# Patient Record
Sex: Male | Born: 1940 | Hispanic: Yes | Marital: Married | Smoking: Never smoker
Health system: Southern US, Community
[De-identification: ages and names within clinical notes are randomized; demographics above are authoritative.]

## PROBLEM LIST (undated history)

## (undated) DIAGNOSIS — F0390 Unspecified dementia without behavioral disturbance: Secondary | ICD-10-CM

---

## 2017-05-24 ENCOUNTER — Emergency Department (HOSPITAL_COMMUNITY)
Admission: EM | Admit: 2017-05-24 | Discharge: 2017-05-24 | Disposition: A | Discharge status: Home / Self Care | Payer: Medicare Other | Attending: Emergency Medicine | Admitting: Emergency Medicine

## 2017-05-24 ENCOUNTER — Other Ambulatory Visit: Payer: Self-pay

## 2017-05-24 ENCOUNTER — Emergency Department (HOSPITAL_COMMUNITY): Payer: Medicare Other

## 2017-05-24 ENCOUNTER — Encounter (HOSPITAL_COMMUNITY): Payer: Self-pay | Admitting: Emergency Medicine

## 2017-05-24 DIAGNOSIS — Z23 Encounter for immunization: Secondary | ICD-10-CM | POA: Insufficient documentation

## 2017-05-24 DIAGNOSIS — Y929 Unspecified place or not applicable: Secondary | ICD-10-CM | POA: Diagnosis not present

## 2017-05-24 DIAGNOSIS — S20312A Abrasion of left front wall of thorax, initial encounter: Secondary | ICD-10-CM | POA: Diagnosis not present

## 2017-05-24 DIAGNOSIS — Y999 Unspecified external cause status: Secondary | ICD-10-CM | POA: Insufficient documentation

## 2017-05-24 DIAGNOSIS — R001 Bradycardia, unspecified: Secondary | ICD-10-CM | POA: Diagnosis not present

## 2017-05-24 DIAGNOSIS — S80819A Abrasion, unspecified lower leg, initial encounter: Secondary | ICD-10-CM | POA: Insufficient documentation

## 2017-05-24 DIAGNOSIS — Y939 Activity, unspecified: Secondary | ICD-10-CM | POA: Diagnosis not present

## 2017-05-24 DIAGNOSIS — S81812A Laceration without foreign body, left lower leg, initial encounter: Secondary | ICD-10-CM | POA: Diagnosis not present

## 2017-05-24 DIAGNOSIS — F0391 Unspecified dementia with behavioral disturbance: Secondary | ICD-10-CM | POA: Diagnosis not present

## 2017-05-24 LAB — TROPONIN I

## 2017-05-24 LAB — CBC WITH DIFFERENTIAL/PLATELET
Basophils Absolute: 0 10*3/uL (ref 0.0–0.1)
Basophils Relative: 0 %
Eosinophils Absolute: 0.3 10*3/uL (ref 0.0–0.7)
Eosinophils Relative: 2 %
HEMATOCRIT: 41.3 % (ref 39.0–52.0)
HEMOGLOBIN: 13.3 g/dL (ref 13.0–17.0)
LYMPHS ABS: 3.1 10*3/uL (ref 0.7–4.0)
LYMPHS PCT: 23 %
MCH: 27.6 pg (ref 26.0–34.0)
MCHC: 32.2 g/dL (ref 30.0–36.0)
MCV: 85.7 fL (ref 78.0–100.0)
MONOS PCT: 5 %
Monocytes Absolute: 0.7 10*3/uL (ref 0.1–1.0)
NEUTROS PCT: 70 %
Neutro Abs: 9.2 10*3/uL — ABNORMAL HIGH (ref 1.7–7.7)
Platelets: 200 10*3/uL (ref 150–400)
RBC: 4.82 MIL/uL (ref 4.22–5.81)
RDW: 13.2 % (ref 11.5–15.5)
WBC: 13.3 10*3/uL — AB (ref 4.0–10.5)

## 2017-05-24 LAB — COMPREHENSIVE METABOLIC PANEL
ALT: 11 U/L — ABNORMAL LOW (ref 17–63)
AST: 19 U/L (ref 15–41)
Albumin: 3.7 g/dL (ref 3.5–5.0)
Alkaline Phosphatase: 57 U/L (ref 38–126)
Anion gap: 11 (ref 5–15)
BUN: 11 mg/dL (ref 6–20)
CHLORIDE: 102 mmol/L (ref 101–111)
CO2: 26 mmol/L (ref 22–32)
Calcium: 9.2 mg/dL (ref 8.9–10.3)
Creatinine, Ser: 0.99 mg/dL (ref 0.61–1.24)
Glucose, Bld: 120 mg/dL — ABNORMAL HIGH (ref 65–99)
POTASSIUM: 3.1 mmol/L — AB (ref 3.5–5.1)
SODIUM: 139 mmol/L (ref 135–145)
Total Bilirubin: 1.1 mg/dL (ref 0.3–1.2)
Total Protein: 7.3 g/dL (ref 6.5–8.1)

## 2017-05-24 LAB — AMMONIA: Ammonia: 24 umol/L (ref 9–35)

## 2017-05-24 LAB — PROTIME-INR
INR: 1.07
Prothrombin Time: 13.8 seconds (ref 11.4–15.2)

## 2017-05-24 MED ORDER — TETANUS-DIPHTH-ACELL PERTUSSIS 5-2.5-18.5 LF-MCG/0.5 IM SUSP
0.5000 mL | Freq: Once | INTRAMUSCULAR | Status: AC
  Administered 2017-05-24: 0.5 mL via INTRAMUSCULAR
  Filled 2017-05-24: qty 0.5

## 2017-05-24 MED ORDER — SODIUM CHLORIDE 0.9 % IV BOLUS (SEPSIS)
1000.0000 mL | Freq: Once | INTRAVENOUS | Status: AC
  Administered 2017-05-24: 1000 mL via INTRAVENOUS

## 2017-05-24 MED ORDER — LIDOCAINE-EPINEPHRINE (PF) 2 %-1:200000 IJ SOLN
10.0000 mL | Freq: Once | INTRAMUSCULAR | Status: AC
  Administered 2017-05-24: 10 mL
  Filled 2017-05-24: qty 20

## 2017-05-24 MED ORDER — LIDOCAINE-EPINEPHRINE (PF) 2 %-1:200000 IJ SOLN: 20.0000 mL | Freq: Once | INTRAMUSCULAR | Status: DC
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
## 2017-05-26 ENCOUNTER — Emergency Department (HOSPITAL_COMMUNITY): Payer: Medicare Other

## 2017-05-26 ENCOUNTER — Other Ambulatory Visit: Payer: Self-pay

## 2017-05-26 ENCOUNTER — Encounter (HOSPITAL_COMMUNITY): Payer: Self-pay | Admitting: *Deleted

## 2017-05-26 ENCOUNTER — Emergency Department (HOSPITAL_COMMUNITY)
Admission: EM | Admit: 2017-05-26 | Discharge: 2017-05-26 | Disposition: A | Discharge status: Home / Self Care | Payer: Medicare Other | Attending: Emergency Medicine | Admitting: Emergency Medicine

## 2017-05-26 DIAGNOSIS — Y999 Unspecified external cause status: Secondary | ICD-10-CM | POA: Insufficient documentation

## 2017-05-26 DIAGNOSIS — Y9389 Activity, other specified: Secondary | ICD-10-CM | POA: Insufficient documentation

## 2017-05-26 DIAGNOSIS — S0990XA Unspecified injury of head, initial encounter: Secondary | ICD-10-CM | POA: Diagnosis present

## 2017-05-26 DIAGNOSIS — R42 Dizziness and giddiness: Secondary | ICD-10-CM

## 2017-05-26 DIAGNOSIS — Y92410 Unspecified street and highway as the place of occurrence of the external cause: Secondary | ICD-10-CM | POA: Insufficient documentation

## 2017-05-26 DIAGNOSIS — Z79899 Other long term (current) drug therapy: Secondary | ICD-10-CM | POA: Insufficient documentation

## 2017-05-26 DIAGNOSIS — S060X0A Concussion without loss of consciousness, initial encounter: Secondary | ICD-10-CM | POA: Diagnosis not present

## 2017-05-26 DIAGNOSIS — F0390 Unspecified dementia without behavioral disturbance: Secondary | ICD-10-CM | POA: Insufficient documentation

## 2017-05-26 DIAGNOSIS — R030 Elevated blood-pressure reading, without diagnosis of hypertension: Secondary | ICD-10-CM | POA: Diagnosis not present

## 2017-05-26 HISTORY — DX: Unspecified dementia, unspecified severity, without behavioral disturbance, psychotic disturbance, mood disturbance, and anxiety: F03.90

## 2017-05-26 LAB — CBC WITH DIFFERENTIAL/PLATELET
BASOS ABS: 0 10*3/uL (ref 0.0–0.1)
Basophils Relative: 0 %
EOS PCT: 1 %
Eosinophils Absolute: 0.1 10*3/uL (ref 0.0–0.7)
HEMATOCRIT: 37.5 % — AB (ref 39.0–52.0)
HEMOGLOBIN: 12.5 g/dL — AB (ref 13.0–17.0)
LYMPHS ABS: 1.7 10*3/uL (ref 0.7–4.0)
LYMPHS PCT: 15 %
MCH: 28.7 pg (ref 26.0–34.0)
MCHC: 33.3 g/dL (ref 30.0–36.0)
MCV: 86.2 fL (ref 78.0–100.0)
Monocytes Absolute: 0.4 10*3/uL (ref 0.1–1.0)
Monocytes Relative: 4 %
NEUTROS ABS: 8.7 10*3/uL — AB (ref 1.7–7.7)
NEUTROS PCT: 80 %
PLATELETS: 194 10*3/uL (ref 150–400)
RBC: 4.35 MIL/uL (ref 4.22–5.81)
RDW: 13.5 % (ref 11.5–15.5)
WBC: 10.9 10*3/uL — AB (ref 4.0–10.5)

## 2017-05-26 LAB — COMPREHENSIVE METABOLIC PANEL
ALK PHOS: 51 U/L (ref 38–126)
ALT: 11 U/L — AB (ref 17–63)
AST: 17 U/L (ref 15–41)
Albumin: 3.5 g/dL (ref 3.5–5.0)
Anion gap: 9 (ref 5–15)
BUN: 12 mg/dL (ref 6–20)
CALCIUM: 8.9 mg/dL (ref 8.9–10.3)
CHLORIDE: 105 mmol/L (ref 101–111)
CO2: 26 mmol/L (ref 22–32)
CREATININE: 1.04 mg/dL (ref 0.61–1.24)
GFR calc non Af Amer: >60 mL/min (ref >60)
Glucose, Bld: 110 mg/dL — ABNORMAL HIGH (ref 65–99)
Potassium: 3.3 mmol/L — ABNORMAL LOW (ref 3.5–5.1)
Sodium: 140 mmol/L (ref 135–145)
Total Bilirubin: 0.9 mg/dL (ref 0.3–1.2)
Total Protein: 6.4 g/dL — ABNORMAL LOW (ref 6.5–8.1)

## 2017-05-26 LAB — I-STAT TROPONIN, ED: TROPONIN I, POC: 0 ng/mL (ref 0.00–0.08)

## 2017-05-26 MED ORDER — GADOBENATE DIMEGLUMINE 529 MG/ML IV SOLN
15.0000 mL | Freq: Once | INTRAVENOUS | Status: AC | PRN
  Administered 2017-05-26: 15 mL via INTRAVENOUS
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
## 2017-05-26 NOTE — ED Notes (Signed)
Patient returned to room from MRI
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
## 2017-05-26 NOTE — ED Notes (Signed)
Patient being transported to xray & then to MRI via stretcher.

## 2017-05-26 NOTE — ED Notes (Signed)
Sat pt up on stretcher to help him undress and get into a gown.  Pt began vomiting at that time.  Checked BP.  185/124 and then on followup 158/86.
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
## 2017-05-26 NOTE — ED Notes (Signed)
Phlebotomy at bedside at this time.

## 2017-05-26 NOTE — ED Notes (Signed)
Patient transported to MRI 

## 2017-05-26 NOTE — ED Notes (Signed)
Patient returned to room. 

## 2019-01-14 DEATH — deceased
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
